# Patient Record
Sex: Female | Born: 1975 | Race: White | Hispanic: No | Marital: Single | Smoking: Former smoker
Health system: Southern US, Community
[De-identification: ages and names within clinical notes are randomized; demographics above are authoritative.]

## PROBLEM LIST (undated history)

## (undated) DIAGNOSIS — E079 Disorder of thyroid, unspecified: Secondary | ICD-10-CM

## (undated) DIAGNOSIS — G932 Benign intracranial hypertension: Secondary | ICD-10-CM

## (undated) HISTORY — PX: OTHER SURGICAL HISTORY: SHX169

## (undated) HISTORY — DX: Disorder of thyroid, unspecified: E07.9

## (undated) HISTORY — DX: Benign intracranial hypertension: G93.2

---

## 2015-06-16 ENCOUNTER — Ambulatory Visit (INDEPENDENT_AMBULATORY_CARE_PROVIDER_SITE_OTHER): Payer: Medicaid Other | Admitting: Family Medicine

## 2015-06-16 ENCOUNTER — Encounter: Payer: Self-pay | Admitting: Family Medicine

## 2015-06-16 VITALS — BP 130/78 | HR 100 | Temp 98.8°F | Resp 18 | Ht 67.0 in | Wt 383.3 lb

## 2015-06-16 DIAGNOSIS — R0609 Other forms of dyspnea: Secondary | ICD-10-CM | POA: Insufficient documentation

## 2015-06-16 DIAGNOSIS — R600 Localized edema: Secondary | ICD-10-CM

## 2015-06-16 DIAGNOSIS — G932 Benign intracranial hypertension: Secondary | ICD-10-CM | POA: Diagnosis not present

## 2015-06-16 MED ORDER — FUROSEMIDE 40 MG PO TABS: 40.0000 mg | ORAL_TABLET | Freq: Two times a day (BID) | ORAL | Status: DC

## 2015-06-16 NOTE — Progress Notes (Signed)
Name: Anna Fitzgerald   MRN: [id number]    DOB: 07/15/1976   Date:06/16/2015       Progress Note  Subjective  Chief Complaint  Chief Complaint  Patient presents with  . Leg Swelling    New patient to practice  . Foot Swelling    HPI   Leg Swelling Pt. Presents with slowly worsening swelling of both legs over the last few months. She has some shortness of breath with usual activities, profuse sweating, no chest pain or tightness. Pt. Has history of pseudotumor cerebri in the past and has been on Acetazolamide. This was stopped due to uncomfortable numbness and tingling in both hands.  Past Medical History  Diagnosis Date  . Thyroid disease   . Pseudotumor cerebri     Was on Acetazolamide.  . Thyroid disease     Discovered during pregnancy, was on Levothyroxine.    History reviewed. No pertinent past surgical history.  Family History  Problem Relation Age of Onset  . Hypertension Father     Social History   Social History  . Marital Status: Single    Spouse Name: N/A  . Number of Children: N/A  . Years of Education: N/A   Occupational History  . Not on file.   Social History Main Topics  . Smoking status: Never Smoker   . Smokeless tobacco: Not on file  . Alcohol Use: No  . Drug Use: No  . Sexual Activity: Not Currently   Other Topics Concern  . Not on file   Social History Narrative  . No narrative on file    No current outpatient prescriptions on file.  Allergies  Allergen Reactions  . Sulfa Antibiotics     Other reaction(s): OTHER   Review of Systems  Constitutional: Negative for fever and chills.  Eyes: Positive for blurred vision (intermittent blurred vision).  Respiratory: Positive for shortness of breath.   Cardiovascular: Positive for leg swelling. Negative for chest pain.  Neurological: Positive for dizziness and headaches.    Objective  Filed Vitals:   06/16/15 1336  BP: 130/78  Pulse: 100  Temp: 98.8 F (37.1 C)  TempSrc: Oral   Resp: 18  Height:  (1.702 m)  Weight: 383 lb 4.8 oz (173.864 kg)  SpO2: 97%    Physical Exam  Constitutional: She is oriented to person, place, and time and well-developed, well-nourished, and in no distress.  HENT:  Head: Normocephalic and atraumatic.  Cardiovascular: Normal rate, regular rhythm and normal heart sounds.   Pulmonary/Chest: Effort normal and breath sounds normal. She has no wheezes. She has no rales.  Abdominal: Soft. Bowel sounds are normal. There is no tenderness.  Musculoskeletal:  Generalized bilateral lower extremity swelling with tenderness to deep palpation.  Neurological: She is alert and oriented to person, place, and time.  Skin: Skin is warm and dry.  Nursing note and vitals reviewed.    Assessment & Plan  1. Pseudotumor cerebri Patient will be referred to neurology for further management of pseudotumor cerebri. She has been on acetazolamide in the past and does not wish to take it again because of paresthesias and numbness in both hands. We will start on furosemide 40 mg 2 times daily. Patient reportedly has allergies to sulfonamides but does not remember the type of reaction. - Ambulatory referral to Neurology - furosemide (LASIX) 40 MG tablet; Take 1 tablet (40 mg total) by mouth 2 (two) times daily.  Dispense: 60 tablet; Refill: 0  2. Bilateral leg edema  We'll obtain standard lab work for bilateral lower extremity swelling and start on furosemide 40 mg twice a day.in the past, patient reportedly had allergies to sulfa antibiotics. When asked, patient does not remember which antibiotic and  that she was a child so she does not number the exact allergic reaction. She has tolerated Acetazolamide well with only side effect of paresthesias but no allergic reaction. We have discussed with the pharmacist about the likelihood of furosemide causing an allergic reaction based on her history.we will hold off on therapy for now but will refer to neurology for  further assessment. - CBC with Differential - Comprehensive Metabolic Panel (CMET) - DG Chest 2 View; Future - TSH - T4, free - furosemide (LASIX) 40 MG tablet; Take 1 tablet (40 mg total) by mouth 2 (two) times daily.  Dispense: 60 tablet; Refill: 0  3. Dyspnea on effort  - DG Chest 2 View; Future   Anna Fitzgerald Asad A. Faylene KurtzShah [facility] 06/16/2015 1:57 PM

## 2015-06-17 ENCOUNTER — Telehealth: Payer: Self-pay | Admitting: Family Medicine

## 2015-06-17 LAB — COMPREHENSIVE METABOLIC PANEL
A/G RATIO: 2 (ref 1.1–2.5)
ALBUMIN: 4.1 g/dL (ref 3.5–5.5)
ALK PHOS: 84 IU/L (ref 39–117)
ALT: 18 IU/L (ref 0–32)
AST: 17 IU/L (ref 0–40)
BILIRUBIN TOTAL: 0.9 mg/dL (ref 0.0–1.2)
BUN / CREAT RATIO: 18 (ref 8–20)
BUN: 13 mg/dL (ref 6–20)
CHLORIDE: 102 mmol/L (ref 97–106)
CO2: 24 mmol/L (ref 18–29)
Calcium: 8.8 mg/dL (ref 8.7–10.2)
Creatinine, Ser: 0.74 mg/dL (ref 0.57–1.00)
GFR calc non Af Amer: 104 mL/min/{1.73_m2} (ref >59)
GFR, EST AFRICAN AMERICAN: 120 mL/min/{1.73_m2} (ref >59)
GLUCOSE: 78 mg/dL (ref 65–99)
Globulin, Total: 2.1 g/dL (ref 1.5–4.5)
POTASSIUM: 4.3 mmol/L (ref 3.5–5.2)
Sodium: 141 mmol/L (ref 136–144)
TOTAL PROTEIN: 6.2 g/dL (ref 6.0–8.5)

## 2015-06-17 LAB — CBC WITH DIFFERENTIAL/PLATELET
BASOS ABS: 0 10*3/uL (ref 0.0–0.2)
BASOS: 0 %
EOS (ABSOLUTE): 0.2 10*3/uL (ref 0.0–0.4)
Eos: 3 %
Hematocrit: 40 % (ref 34.0–46.6)
Hemoglobin: 13.6 g/dL (ref 11.1–15.9)
IMMATURE GRANS (ABS): 0 10*3/uL (ref 0.0–0.1)
Immature Granulocytes: 0 %
LYMPHS ABS: 1.5 10*3/uL (ref 0.7–3.1)
Lymphs: 19 %
MCH: 27.2 pg (ref 26.6–33.0)
MCHC: 34 g/dL (ref 31.5–35.7)
MCV: 80 fL (ref 79–97)
Monocytes Absolute: 0.4 10*3/uL (ref 0.1–0.9)
Monocytes: 5 %
NEUTROS ABS: 5.8 10*3/uL (ref 1.4–7.0)
Neutrophils: 73 %
PLATELETS: 223 10*3/uL (ref 150–379)
RBC: 5 x10E6/uL (ref 3.77–5.28)
RDW: 14.2 % (ref 12.3–15.4)
WBC: 8 10*3/uL (ref 3.4–10.8)

## 2015-06-17 LAB — TSH: TSH: 7.2 u[IU]/mL — AB (ref 0.450–4.500)

## 2015-06-17 LAB — T4, FREE: Free T4: 1.14 ng/dL (ref 0.82–1.77)

## 2015-06-17 MED ORDER — LEVOTHYROXINE SODIUM 25 MCG PO TABS: 25.0000 ug | ORAL_TABLET | Freq: Every day | ORAL | Status: DC

## 2015-06-17 NOTE — Telephone Encounter (Signed)
PT SAID THAT A RX WAS CALLED INTO [facility] IN [place] AND THEY CALLED BECAUSE THEY SEEN SHE HAS ALLERGIC REACTION TO SULFAR AND THE PHARM CALLED AND SPOKE WITH THE DR AND SHE CALLED TO SEE WHAT THE DR HAD DECIDED AND THE PHARM SAYS THAT THEY HAVE NO RX AT ALL FOR HER NOW. WHAT DID THE DR DECIDE FOR HER TO TAKE?

## 2015-06-17 NOTE — Telephone Encounter (Signed)
Returned call and left a voice message for patient. Spoke with pharmacist last night and discussed patient's possible allergies to sulfonamides. She has been on Acetazolamide for pseudotumor cerebri without any obvious symptoms of allergies except for paresthesias in her hands and she does not wish to take Acetazolamide again. Furosemide is a possible second line therapy for pseudotumor cerebri but in light of her possible allergies to sulfonamides, we'll recommend that patient weight until seen by neurology.

## 2015-07-18 ENCOUNTER — Ambulatory Visit (INDEPENDENT_AMBULATORY_CARE_PROVIDER_SITE_OTHER): Payer: Medicaid Other | Admitting: Family Medicine

## 2015-07-18 ENCOUNTER — Encounter: Payer: Self-pay | Admitting: Family Medicine

## 2015-07-18 VITALS — BP 132/78 | HR 102 | Temp 97.5°F | Resp 16 | Ht 67.0 in | Wt 385.9 lb

## 2015-07-18 DIAGNOSIS — E039 Hypothyroidism, unspecified: Secondary | ICD-10-CM

## 2015-07-18 DIAGNOSIS — J0100 Acute maxillary sinusitis, unspecified: Secondary | ICD-10-CM | POA: Insufficient documentation

## 2015-07-18 DIAGNOSIS — N644 Mastodynia: Secondary | ICD-10-CM

## 2015-07-18 MED ORDER — AMOXICILLIN-POT CLAVULANATE 875-125 MG PO TABS: 1.0000 | ORAL_TABLET | Freq: Two times a day (BID) | ORAL | Status: DC

## 2015-07-18 NOTE — Progress Notes (Signed)
Name: Anna Fitzgerald   MRN: [id number]    DOB: 1976-01-17   Date:07/18/2015       Progress Note  Subjective  Chief Complaint  Chief Complaint  Patient presents with  . Foot Swelling    unchanged, but a little better today.  Never started lasix due to allergie.  Has an appt next monday with neurologist    Thyroid Problem Presents for follow-up visit. Symptoms include cold intolerance, constipation, depressed mood, dry skin, fatigue (recently feeling fatigued), hair loss, heat intolerance and leg swelling. The symptoms have been stable. Past treatments include levothyroxine. The following procedures have not been performed: thyroid ultrasound and thyroidectomy. Her past medical history is significant for obesity. There is no history of diabetes.  Sinusitis This is a new problem. Episode onset: 10 days ago. The problem is unchanged. There has been no fever. Associated symptoms include congestion, coughing, headaches and sinus pressure. Pertinent negatives include no sore throat (having a 'tickle' in her throat). Past treatments include nothing.    Past Medical History  Diagnosis Date  . Thyroid disease   . Pseudotumor cerebri     Was on Acetazolamide.  . Thyroid disease     Discovered during pregnancy, was on Levothyroxine.    No past surgical history on file.  Family History  Problem Relation Age of Onset  . Hypertension Father     Social History   Social History  . Marital Status: Single    Spouse Name: N/A  . Number of Children: N/A  . Years of Education: N/A   Occupational History  . Not on file.   Social History Main Topics  . Smoking status: Never Smoker   . Smokeless tobacco: Not on file  . Alcohol Use: No  . Drug Use: No  . Sexual Activity: Not Currently   Other Topics Concern  . Not on file   Social History Narrative     Current outpatient prescriptions:  .  levothyroxine (LEVOTHROID) 25 MCG tablet, Take 1 tablet (25 mcg total) by mouth daily before  breakfast., Disp: 30 tablet, Rfl: 1  Allergies  Allergen Reactions  . Sulfa Antibiotics     Other reaction(s): OTHER   Review of Systems  Constitutional: Positive for malaise/fatigue and fatigue (recently feeling fatigued).  HENT: Positive for congestion and sinus pressure. Negative for sore throat (having a 'tickle' in her throat).   Respiratory: Positive for cough.   Gastrointestinal: Positive for constipation.  Neurological: Positive for headaches.  Endo/Heme/Allergies: Positive for cold intolerance and heat intolerance.    Objective  Filed Vitals:   07/18/15 1105  BP: 132/78  Pulse: 102  Temp: 97.5 F (36.4 C)  TempSrc: Oral  Resp: 16  Height:  (1.702 m)  Weight: 385 lb 14.4 oz (175.043 kg)  SpO2: 96%    Physical Exam  Constitutional: She is oriented to person, place, and time and well-developed, well-nourished, and in no distress.  HENT:  Right Ear: Tympanic membrane and ear canal normal.  Left Ear: Tympanic membrane and ear canal normal.  Nose: Right sinus exhibits maxillary sinus tenderness and frontal sinus tenderness. Left sinus exhibits maxillary sinus tenderness and frontal sinus tenderness.  Mouth/Throat: Mucous membranes are normal. Posterior oropharyngeal erythema present.  Cardiovascular: Normal rate, regular rhythm and normal heart sounds.   Neurological: She is alert and oriented to person, place, and time.  Nursing note and vitals reviewed.  Breast Tenderness Pt. Complains of bilateral breast tenderness, first noticed over 2 months ago. No breast mass,  tenderness is constant, no relation to time of month or cycle. No N/V but does have extreme fatigue.  Recent Results (from the past 2160 hour(s))  CBC with Differential     Status: None   Collection Time: 06/16/15  2:23 PM  Result Value Ref Range   WBC 8.0 3.4 - 10.8 x10E3/uL   RBC 5.00 3.77 - 5.28 x10E6/uL   Hemoglobin 13.6 11.1 - 15.9 g/dL   Hematocrit 16.140.0 09.634.0 - 46.6 %   MCV 80 79 - 97 fL    MCH 27.2 26.6 - 33.0 pg   MCHC 34.0 31.5 - 35.7 g/dL   RDW 04.514.2 40.912.3 - 81.115.4 %   Platelets 223 150 - 379 x10E3/uL   Neutrophils 73 %   Lymphs 19 %   Monocytes 5 %   Eos 3 %   Basos 0 %   Neutrophils Absolute 5.8 1.4 - 7.0 x10E3/uL   Lymphocytes Absolute 1.5 0.7 - 3.1 x10E3/uL   Monocytes Absolute 0.4 0.1 - 0.9 x10E3/uL   EOS (ABSOLUTE) 0.2 0.0 - 0.4 x10E3/uL   Basophils Absolute 0.0 0.0 - 0.2 x10E3/uL   Immature Granulocytes 0 %   Immature Grans (Abs) 0.0 0.0 - 0.1 x10E3/uL  Comprehensive Metabolic Panel (CMET)     Status: None   Collection Time: 06/16/15  2:23 PM  Result Value Ref Range   Glucose 78 65 - 99 mg/dL   BUN 13 6 - 20 mg/dL   Creatinine, Ser 9.140.74 0.57 - 1.00 mg/dL   GFR calc non Af Amer 104 >59 mL/min/1.73   GFR calc Af Amer 120 >59 mL/min/1.73   BUN/Creatinine Ratio 18 8 - 20   Sodium 141 136 - 144 mmol/L   Potassium 4.3 3.5 - 5.2 mmol/L   Chloride 102 97 - 106 mmol/L   CO2 24 18 - 29 mmol/L   Calcium 8.8 8.7 - 10.2 mg/dL   Total Protein 6.2 6.0 - 8.5 g/dL   Albumin 4.1 3.5 - 5.5 g/dL   Globulin, Total 2.1 1.5 - 4.5 g/dL   Albumin/Globulin Ratio 2.0 1.1 - 2.5   Bilirubin Total 0.9 0.0 - 1.2 mg/dL   Alkaline Phosphatase 84 39 - 117 IU/L   AST 17 0 - 40 IU/L   ALT 18 0 - 32 IU/L  TSH     Status: Abnormal   Collection Time: 06/16/15  2:23 PM  Result Value Ref Range   TSH 7.200 (H) 0.450 - 4.500 uIU/mL  T4, free     Status: None   Collection Time: 06/16/15  2:23 PM  Result Value Ref Range   Free T4 1.14 0.82 - 1.77 ng/dL     Assessment & Plan  1. Hypothyroidism, unspecified hypothyroidism type Recheck TSH and Free T4 and follow up. - TSH - T4, free  2. Acute maxillary sinusitis, recurrence not specified  - amoxicillin-clavulanate (AUGMENTIN) 875-125 MG tablet; Take 1 tablet by mouth 2 (two) times daily.  Dispense: 20 tablet; Refill: 0  3. Breast tenderness  - Ambulatory referral to Gynecology   [facility] Asad A. Faylene KurtzShah [facility] 07/18/2015 11:10 AM

## 2015-07-19 LAB — T4, FREE: Free T4: 1.16 ng/dL (ref 0.82–1.77)

## 2015-07-19 LAB — TSH: TSH: 6.96 u[IU]/mL — AB (ref 0.450–4.500)

## 2015-07-27 ENCOUNTER — Other Ambulatory Visit: Payer: Self-pay | Admitting: Neurology

## 2015-07-27 DIAGNOSIS — G932 Benign intracranial hypertension: Secondary | ICD-10-CM

## 2015-07-29 ENCOUNTER — Telehealth: Payer: Self-pay

## 2015-07-29 MED ORDER — LEVOTHYROXINE SODIUM 25 MCG PO TABS: 50.0000 ug | ORAL_TABLET | Freq: Every day | ORAL | Status: DC

## 2015-07-29 NOTE — Telephone Encounter (Signed)
Medication has been filled and [facility] to [facility] [place]

## 2015-08-03 ENCOUNTER — Encounter: Payer: Self-pay | Admitting: Obstetrics and Gynecology

## 2015-08-03 ENCOUNTER — Ambulatory Visit (INDEPENDENT_AMBULATORY_CARE_PROVIDER_SITE_OTHER): Payer: Medicaid Other | Admitting: Obstetrics and Gynecology

## 2015-08-03 VITALS — BP 118/73 | HR 80 | Ht 66.0 in | Wt 381.1 lb

## 2015-08-03 DIAGNOSIS — N644 Mastodynia: Secondary | ICD-10-CM

## 2015-08-03 DIAGNOSIS — B359 Dermatophytosis, unspecified: Secondary | ICD-10-CM

## 2015-08-03 MED ORDER — VITAMIN E 180 MG (400 UNIT) PO CAPS: 800.0000 [IU] | ORAL_CAPSULE | Freq: Every day | ORAL | Status: AC

## 2015-08-03 MED ORDER — TOLNAFTATE 1 % EX AERP: INHALATION_SPRAY | Freq: Two times a day (BID) | CUTANEOUS | Status: AC

## 2015-08-03 NOTE — Patient Instructions (Signed)

## 2015-08-03 NOTE — Progress Notes (Signed)
Subjective:     Patient ID: Duffy BruceDanielle Fitzgerald, female   DOB: [birth year], 39 y.o.   MRN: [id number]  HPI Reports sudden onset breast tenderness bilaterally a few months ago, denies any recent trauma to breast, just felt sore when 38 yo son layed his head on her chest. Does report 1-2 pots of coffee daily but no tea or sodas.  Review of Systems Bilateral breast soreness- x2 months  rash under left arm- itchy and red x 1 week    Objective:   Physical Exam A&O x4  well groomed morbidly obese female in no current distress Breasts: breasts appear normal, no suspicious masses, no skin or nipple changes or axillary nodes, risk and benefit of breast self-exam was discussed, generalized tenderness in upper inner quadrants bilaterally.tinea rash noted superficial under left axilla    Assessment:     caffeine induced breast tenderness  tinea of left axilla     Plan:     Vit E 800 IU daily as needed. To decrease caffeine intake daily Tinactin spray to affected area x 1 week and prn  [name], CNM

## 2015-08-11 ENCOUNTER — Ambulatory Visit
Admission: RE | Admit: 2015-08-11 | Discharge: 2015-08-11 | Disposition: A | Discharge status: Home / Self Care | Payer: Medicaid Other | Source: Ambulatory Visit | Attending: Neurology | Admitting: Neurology

## 2015-08-11 DIAGNOSIS — G932 Benign intracranial hypertension: Secondary | ICD-10-CM | POA: Diagnosis not present

## 2015-08-11 DIAGNOSIS — R51 Headache: Secondary | ICD-10-CM | POA: Diagnosis present

## 2015-08-11 LAB — PROTIME-INR
INR: 0.98
Prothrombin Time: 13.2 seconds (ref 11.4–15.0)

## 2015-08-11 LAB — CBC
HEMATOCRIT: 38.6 % (ref 35.0–47.0)
Hemoglobin: 12.6 g/dL (ref 12.0–16.0)
MCH: 26.5 pg (ref 26.0–34.0)
MCHC: 32.7 g/dL (ref 32.0–36.0)
MCV: 81.1 fL (ref 80.0–100.0)
PLATELETS: 155 10*3/uL (ref 150–440)
RBC: 4.76 MIL/uL (ref 3.80–5.20)
RDW: 15 % — AB (ref 11.5–14.5)
WBC: 7 10*3/uL (ref 3.6–11.0)

## 2015-08-11 LAB — APTT: aPTT: 35 seconds (ref 24–36)

## 2015-08-11 LAB — HCG, QUANTITATIVE, PREGNANCY: hCG, Beta Chain, Quant, S: <1 m[IU]/mL (ref <5)

## 2015-11-07 ENCOUNTER — Ambulatory Visit (INDEPENDENT_AMBULATORY_CARE_PROVIDER_SITE_OTHER): Payer: Medicaid Other | Admitting: Family Medicine

## 2015-11-07 ENCOUNTER — Encounter: Payer: Self-pay | Admitting: Family Medicine

## 2015-11-07 VITALS — BP 128/72 | HR 95 | Temp 98.5°F | Resp 18 | Ht 67.0 in | Wt 387.7 lb

## 2015-11-07 DIAGNOSIS — J0100 Acute maxillary sinusitis, unspecified: Secondary | ICD-10-CM

## 2015-11-07 DIAGNOSIS — E039 Hypothyroidism, unspecified: Secondary | ICD-10-CM | POA: Diagnosis not present

## 2015-11-07 MED ORDER — AZITHROMYCIN 250 MG PO TABS: ORAL_TABLET | ORAL | Status: DC

## 2015-11-07 NOTE — Progress Notes (Signed)
Name: Anna Fitzgerald   MRN: [id number]    DOB: [birth year]   Date:11/07/2015       Progress Note  Subjective  Chief Complaint  Chief Complaint  Patient presents with  . Follow-up    Medicaiton not working (levothyroxine)     HPI  Hypothyroidism: Long history of Hypothyroidism, last TSH was elevated at 6.960, current dose of Levothyroxine is 50 mcg daily. She feels no better clinically, (fatigue, hot and cold chills, leg swelling, dry skin) are no better. She is requesting a referral to Endocrinolgoy.  Sinus Symptoms: Symptoms include headache, sinus pressure, sore throat, mild fever. Symptoms present for 2 weeks.  Past Medical History  Diagnosis Date  . Thyroid disease   . Pseudotumor cerebri     Was on Acetazolamide.  . Thyroid disease     Discovered during pregnancy, was on Levothyroxine.    Past Surgical History  Procedure Laterality Date  . None      Family History  Problem Relation Age of Onset  . Hypertension Father   . Diabetes Maternal Grandmother   . Cancer Neg Hx     Social History   Social History  . Marital Status: Single    Spouse Name: N/A  . Number of Children: N/A  . Years of Education: N/A   Occupational History  . Not on file.   Social History Main Topics  . Smoking status: Never Smoker   . Smokeless tobacco: Not on file  . Alcohol Use: 0.0 oz/week    0 Standard drinks or equivalent per week     Comment: wine twice a month  . Drug Use: No  . Sexual Activity: Not Currently    Birth Control/ Protection: IUD   Other Topics Concern  . Not on file   Social History Narrative     Current outpatient prescriptions:  .  Ascorbic Acid (VITAMIN C) 100 MG tablet, Take 100 mg by mouth daily., Disp: , Rfl:  .  levonorgestrel (MIRENA) 20 MCG/24HR IUD, 1 each by Intrauterine route once., Disp: , Rfl:  .  levothyroxine (LEVOTHROID) 25 MCG tablet, Take 2 tablets (50 mcg total) by mouth daily before breakfast., Disp: 60 tablet, Rfl: 0 .  topiramate  (TOPAMAX) 50 MG tablet, Week 1: Take 1 tab and bedtime. Week 2: Take 2 tabs at bedtime. Week 3: Take 3 tabs at bedtime. Week 4: Take 4 tabs at bedtime and continue, Disp: , Rfl:  .  vitamin E (VITAMIN E) 400 UNIT capsule, Take 2 capsules (800 Units total) by mouth daily., Disp: 100 capsule, Rfl: 1 .  tolnaftate (TINACTIN) 1 % spray, Apply topically 2 (two) times daily. (Patient not taking: Reported on 11/07/2015), Disp: 130 g, Rfl: 0  Allergies  Allergen Reactions  . Sulfa Antibiotics     Other reaction(s): OTHER     ROS    Objective  Filed Vitals:   11/07/15 1100  BP: 128/72  Pulse: 95  Temp: 98.5 F (36.9 C)  TempSrc: Oral  Resp: 18  Height:  (1.702 m)  Weight: 387 lb 11.2 oz (175.86 kg)  SpO2: 98%    Physical Exam  Constitutional: She is oriented to person, place, and time and well-developed, well-nourished, and in no distress.  HENT:  Nose: Right sinus exhibits maxillary sinus tenderness. Left sinus exhibits maxillary sinus tenderness.  Mouth/Throat: No posterior oropharyngeal erythema.  Cardiovascular: Normal rate and regular rhythm.   Pulmonary/Chest: Effort normal and breath sounds normal.  Musculoskeletal:  Right ankle: She exhibits swelling.       Left ankle: She exhibits swelling.  Neurological: She is alert and oriented to person, place, and time.  Nursing note and vitals reviewed.      Assessment & Plan  1. Hypothyroidism, unspecified hypothyroidism type Referral to endocrinology. Repeat TFTs - T4, free - T3 Uptake - Ambulatory referral to Endocrinology - TSH   2. Subacute maxillary sinusitis  - azithromycin (ZITHROMAX) 250 MG tablet; 2 tabs po x day 1, then 1 tab po q day x 4 days  Dispense: 6 tablet; Refill: 0   Abdiel Blackerby Asad A. Faylene KurtzShah [facility] 11/07/2015 11:17 AM

## 2015-11-12 LAB — T4, FREE: Free T4: 1.16 ng/dL (ref 0.82–1.77)

## 2015-11-12 LAB — TSH: TSH: 7.13 u[IU]/mL — AB (ref 0.450–4.500)

## 2015-11-12 LAB — T3 UPTAKE: T3 Uptake Ratio: 26 % (ref 24–39)

## 2015-11-16 ENCOUNTER — Telehealth: Payer: Self-pay

## 2015-11-16 MED ORDER — LEVOTHYROXINE SODIUM 75 MCG PO TABS: 75.0000 ug | ORAL_TABLET | ORAL | Status: AC

## 2015-11-16 NOTE — Telephone Encounter (Signed)
Lab results have been reported to patient and a prescription for Levothyroxine 75 mcg every morning has been sent to [facility] per Dr. Sherryll BurgerShah, patient has been notified

## 2016-03-07 ENCOUNTER — Encounter: Payer: Self-pay | Admitting: Family Medicine

## 2016-03-07 ENCOUNTER — Ambulatory Visit
Admission: RE | Admit: 2016-03-07 | Discharge: 2016-03-07 | Disposition: A | Discharge status: Home / Self Care | Payer: Medicaid Other | Source: Ambulatory Visit | Attending: Family Medicine | Admitting: Family Medicine

## 2016-03-07 ENCOUNTER — Ambulatory Visit (INDEPENDENT_AMBULATORY_CARE_PROVIDER_SITE_OTHER): Payer: Medicaid Other | Admitting: Family Medicine

## 2016-03-07 DIAGNOSIS — R0683 Snoring: Secondary | ICD-10-CM | POA: Insufficient documentation

## 2016-03-07 DIAGNOSIS — W260XXA Contact with knife, initial encounter: Secondary | ICD-10-CM | POA: Insufficient documentation

## 2016-03-07 DIAGNOSIS — S6991XA Unspecified injury of right wrist, hand and finger(s), initial encounter: Secondary | ICD-10-CM | POA: Diagnosis present

## 2016-03-07 NOTE — Progress Notes (Signed)
Name: Anna Fitzgerald   MRN: [id number]    DOB: 09/01/1975   Date:03/07/2016       Progress Note  Subjective  Chief Complaint  Chief Complaint  Patient presents with  . Hand Pain    right thumb injury  . Referral    for sleep study    HPI  R. Thumb Pain:  Pt. Presents for slowly worsening pain in the right thumb, started after she 'slashed my thumb open' 2 months ago. She was cutting apples and the knife blade dislodged and she cut her right thumb. Her dad bandaged the area immediately, then applied super-glue to the thumb 4 days later. She reports the thumb has healed relatively well but she has pain in the base and the second joint of the thumb. She feels a 'click' in her thumb with flexion, pain is slowly worsening and she is concerned may have 'nicked a tendon'.    Referral for Sleep Study: Pt. Requesting a referral for sleep study, she has never had a sleep study before but reports snoring 'like a logger'. She is morbidly obese, always had problems while breathing as a child. She could not tolerate a sleep study many years ago which involved putting a mask on her face.She is certain that she has sleep apnea.    Past Medical History:  Diagnosis Date  . Pseudotumor cerebri    Was on Acetazolamide.  . Thyroid disease   . Thyroid disease    Discovered during pregnancy, was on Levothyroxine.    Past Surgical History:  Procedure Laterality Date  . none      Family History  Problem Relation Age of Onset  . Hypertension Father   . Diabetes Maternal Grandmother   . Cancer Neg Hx     Social History   Social History  . Marital status: Single    Spouse name: N/A  . Number of children: N/A  . Years of education: N/A   Occupational History  . Not on file.   Social History Main Topics  . Smoking status: Never Smoker  . Smokeless tobacco: Not on file  . Alcohol use 0.0 oz/week     Comment: wine twice a month  . Drug use: No  . Sexual activity: Not Currently   Birth control/ protection: IUD   Other Topics Concern  . Not on file   Social History Narrative  . No narrative on file     Current Outpatient Prescriptions:  .  Ascorbic Acid (VITAMIN C) 100 MG tablet, Take 100 mg by mouth daily., Disp: , Rfl:  .  azithromycin (ZITHROMAX) 250 MG tablet, 2 tabs po x day 1, then 1 tab po q day x 4 days, Disp: 6 tablet, Rfl: 0 .  levonorgestrel (MIRENA) 20 MCG/24HR IUD, 1 each by Intrauterine route once., Disp: , Rfl:  .  levothyroxine (SYNTHROID, LEVOTHROID) 75 MCG tablet, Take 1 tablet (75 mcg total) by mouth every morning., Disp: 90 tablet, Rfl: 0 .  tolnaftate (TINACTIN) 1 % spray, Apply topically 2 (two) times daily., Disp: 130 g, Rfl: 0 .  topiramate (TOPAMAX) 50 MG tablet, Week 1: Take 1 tab and bedtime. Week 2: Take 2 tabs at bedtime. Week 3: Take 3 tabs at bedtime. Week 4: Take 4 tabs at bedtime and continue, Disp: , Rfl:  .  vitamin E (VITAMIN E) 400 UNIT capsule, Take 2 capsules (800 Units total) by mouth daily., Disp: 100 capsule, Rfl: 1  Allergies  Allergen Reactions  . Sulfa Antibiotics  Other reaction(s): OTHER     Review of Systems  Constitutional: Negative for chills and fever.  Respiratory: Negative for cough and shortness of breath.   Cardiovascular: Negative for chest pain and palpitations.  Musculoskeletal: Positive for joint pain.    Objective  Vitals:   03/07/16 1019  BP: 132/84  Pulse: (!) 112  Resp: 18  Temp: 98.6 F (37 C)  TempSrc: Oral  SpO2: 97%  Weight: (!) 372 lb 8 oz (169 kg)  Height:  (1.702 m)    Physical Exam  Constitutional: She is well-developed, well-nourished, and in no distress.  HENT:  Head: Normocephalic and atraumatic.  Mouth/Throat: No posterior oropharyngeal erythema.  Neck: Neck supple.  Cardiovascular: Normal rate, regular rhythm, S1 normal and S2 normal.   Pulmonary/Chest: Effort normal. No respiratory distress. She has no decreased breath sounds. She has no rhonchi.    Musculoskeletal:       Hands: Tenderness to palpation over the base of right thumb extending proximally towards the wrist joint, no erythema or swelling. Normal ROM right thumb with some pain at maximal hyperflexion.   Psychiatric: Mood, memory, affect and judgment normal.  Nursing note and vitals reviewed.       Assessment & Plan  1. Injury of right thumb, initial encounter Obtain x-ray of right hand, may need referral to hand specialist - DG Hand Complete Right; Future  2. Snoring  - Ambulatory referral to Sleep Studies   Anna Fitzgerald Asad A. Faylene Kurtz [facility] 03/07/2016 10:25 AM

## 2016-03-09 ENCOUNTER — Telehealth: Payer: Self-pay | Admitting: Emergency Medicine

## 2016-03-09 DIAGNOSIS — S6991XA Unspecified injury of right wrist, hand and finger(s), initial encounter: Secondary | ICD-10-CM

## 2016-03-09 NOTE — Telephone Encounter (Signed)
Patient notified of results and appointment was made

## 2016-04-09 ENCOUNTER — Encounter: Payer: Medicaid Other | Admitting: Family Medicine

## 2016-04-23 ENCOUNTER — Ambulatory Visit
Admission: RE | Admit: 2016-04-23 | Discharge: 2016-04-23 | Disposition: A | Discharge status: Home / Self Care | Payer: Medicaid Other | Source: Ambulatory Visit | Attending: Family Medicine | Admitting: Family Medicine

## 2016-04-23 ENCOUNTER — Ambulatory Visit (INDEPENDENT_AMBULATORY_CARE_PROVIDER_SITE_OTHER): Payer: Medicaid Other | Admitting: Family Medicine

## 2016-04-23 ENCOUNTER — Encounter: Payer: Self-pay | Admitting: Family Medicine

## 2016-04-23 VITALS — BP 138/76 | HR 96 | Temp 97.9°F | Resp 18 | Ht 67.0 in | Wt 363.3 lb

## 2016-04-23 DIAGNOSIS — M25512 Pain in left shoulder: Secondary | ICD-10-CM | POA: Diagnosis not present

## 2016-04-23 MED ORDER — NAPROXEN 500 MG PO TABS: 500.0000 mg | ORAL_TABLET | Freq: Two times a day (BID) | ORAL | 0 refills | Status: AC

## 2016-04-23 NOTE — Progress Notes (Signed)
Name: Anna Fitzgerald   MRN: [id number]    DOB: 03-17-1976   Date:04/23/2016       Progress Note  Subjective  Chief Complaint  Chief Complaint  Patient presents with  . Annual Exam    Shoulder Pain   The pain is present in the left shoulder and left arm (feels pain and a feeling as if shoulder is 'out of joint; or 'pulling away'). This is a new problem. The current episode started more than 1 month ago (4 months ago). The pain is at a severity of 6/10. Associated symptoms include numbness (sometimes when she is sleeping with left arm above her head, she feels as if her entire arm is gone to sleep). Pertinent negatives include no fever. The symptoms are aggravated by activity (raising the arm above midline). Treatments tried: Has tried 'trigger point therapy' at home. The treatment provided no relief.    Past Medical History:  Diagnosis Date  . Pseudotumor cerebri    Was on Acetazolamide.  . Thyroid disease   . Thyroid disease    Discovered during pregnancy, was on Levothyroxine.    Past Surgical History:  Procedure Laterality Date  . none      Family History  Problem Relation Age of Onset  . Hypertension Father   . Diabetes Maternal Grandmother   . Cancer Neg Hx     Social History   Social History  . Marital status: Single    Spouse name: N/A  . Number of children: N/A  . Years of education: N/A   Occupational History  . Not on file.   Social History Main Topics  . Smoking status: Never Smoker  . Smokeless tobacco: Not on file  . Alcohol use 0.0 oz/week     Comment: wine twice a month  . Drug use: No  . Sexual activity: Not Currently    Birth control/ protection: IUD   Other Topics Concern  . Not on file   Social History Narrative  . No narrative on file     Current Outpatient Prescriptions:  .  Ascorbic Acid (VITAMIN C) 100 MG tablet, Take 100 mg by mouth daily., Disp: , Rfl:  .  docusate sodium (COLACE) 250 MG capsule, Take by mouth., Disp: , Rfl:   .  levonorgestrel (MIRENA) 20 MCG/24HR IUD, 1 each by Intrauterine route once., Disp: , Rfl:  .  levothyroxine (SYNTHROID, LEVOTHROID) 75 MCG tablet, Take 1 tablet (75 mcg total) by mouth every morning., Disp: 90 tablet, Rfl: 0 .  phentermine 15 MG capsule, Take by mouth., Disp: , Rfl:  .  tolnaftate (TINACTIN) 1 % spray, Apply topically 2 (two) times daily., Disp: 130 g, Rfl: 0 .  topiramate (TOPAMAX) 50 MG tablet, Week 1: Take 1 tab and bedtime. Week 2: Take 2 tabs at bedtime. Week 3: Take 3 tabs at bedtime. Week 4: Take 4 tabs at bedtime and continue, Disp: , Rfl:  .  vitamin E (VITAMIN E) 400 UNIT capsule, Take 2 capsules (800 Units total) by mouth daily., Disp: 100 capsule, Rfl: 1  Allergies  Allergen Reactions  . Sulfa Antibiotics     Other reaction(s): OTHER    Review of Systems  Constitutional: Negative for fever.  Musculoskeletal: Positive for joint pain.  Neurological: Positive for numbness (sometimes when she is sleeping with left arm above her head, she feels as if her entire arm is gone to sleep).    Objective  Vitals:   04/23/16 1121  BP: 138/76  Pulse:  96  Resp: 18  Temp: 97.9 F (36.6 C)  TempSrc: Oral  SpO2: 96%  Weight: (!) 363 lb 4.8 oz (164.8 kg)  Height: 5\' 7"  (1.702 m)    Physical Exam  Constitutional: She is oriented to person, place, and time and well-developed, well-nourished, and in no distress.  Cardiovascular: Normal rate, regular rhythm and normal heart sounds.   No murmur heard. Pulmonary/Chest: Effort normal and breath sounds normal.  Musculoskeletal:       Left shoulder: She exhibits tenderness, swelling and pain.  Pain worse with raising the left arm past midline, internal rotation of the left shoulder joint is painful,   Neurological: She is alert and oriented to person, place, and time.  Psychiatric: Mood, memory, affect and judgment normal.  Nursing note and vitals reviewed.   Assessment & Plan  1. Left shoulder pain Based on  symptoms and exam, suspect rotator cuff pathology start on NSAID therapy and obtain x-ray. We will refer to orthopedics after review of x-ray - DG Shoulder Left; Future - naproxen (NAPROSYN) 500 MG tablet; Take 1 tablet (500 mg total) by mouth 2 (two) times daily with a meal.  Dispense: 30 tablet; Refill: 0   Ronaldo Crilly Asad A. Faylene KurtzShah [facility] 04/23/2016 11:32 AM

## 2016-04-26 ENCOUNTER — Other Ambulatory Visit: Payer: Self-pay | Admitting: Family Medicine

## 2016-04-26 DIAGNOSIS — M25512 Pain in left shoulder: Secondary | ICD-10-CM | POA: Insufficient documentation

## 2017-02-05 ENCOUNTER — Telehealth: Payer: Self-pay | Admitting: Family Medicine

## 2017-02-14 NOTE — Telephone Encounter (Signed)
errenous °

## 2017-04-05 ENCOUNTER — Encounter: Payer: Self-pay | Admitting: Family Medicine

## 2017-04-05 ENCOUNTER — Ambulatory Visit (INDEPENDENT_AMBULATORY_CARE_PROVIDER_SITE_OTHER): Payer: Medicaid Other | Admitting: Family Medicine

## 2017-04-05 VITALS — BP 136/72 | HR 98 | Temp 97.7°F | Resp 16 | Ht 67.0 in | Wt >= 6400 oz

## 2017-04-05 DIAGNOSIS — Z6841 Body Mass Index (BMI) 40.0 and over, adult: Secondary | ICD-10-CM

## 2017-04-05 DIAGNOSIS — F331 Major depressive disorder, recurrent, moderate: Secondary | ICD-10-CM

## 2017-04-05 DIAGNOSIS — R0683 Snoring: Secondary | ICD-10-CM | POA: Diagnosis not present

## 2017-04-05 MED ORDER — BUPROPION HCL ER (XL) 150 MG PO TB24: 150.0000 mg | ORAL_TABLET | Freq: Every day | ORAL | 0 refills | Status: DC

## 2017-04-05 MED ORDER — LIRAGLUTIDE -WEIGHT MANAGEMENT 18 MG/3ML ~~LOC~~ SOPN: 3.0000 mg | PEN_INJECTOR | Freq: Every day | SUBCUTANEOUS | 2 refills | Status: DC

## 2017-04-05 NOTE — Progress Notes (Signed)
Name: Anna Fitzgerald   MRN: [id number]    DOB: 02-08-1976   Date:04/05/2017       Progress Note  Subjective  Chief Complaint  Chief Complaint  Patient presents with  . Follow-up    Abdonrmal labs from Endo Doctor    HPI  Pt. Presents for follow up of abnormal weight gain and generalized swelling, she has history of hypothyroidism and was being followed by Endocrinologist, on Levothyroxine 112 mcg qAM based on last office note. Patient reports having gained at least 60 lbs in last 4 months. Prior to that, she had lost 50 lbs using Phentermine which was prescribed by Endocrinologist.  She reports feeling frustrated at weight gain and feels depressed. She has been swelling a lot in legs and ankles, hands and face.  She had her thyroid medication adjusted based on lab work but other than that, she is on no prescription medication.  She reports having been told that she snores at night, has not been able to get a sleep study completed.  Because of her weight gain, she feels depressed, cries frequently, avoids activities that she once enjoyed and feels sad most of the time. She has history of major depression as a child and was briefly on psychotropic medication.   Past Medical History:  Diagnosis Date  . Pseudotumor cerebri    Was on Acetazolamide.  . Thyroid disease   . Thyroid disease    Discovered during pregnancy, was on Levothyroxine.    Past Surgical History:  Procedure Laterality Date  . none      Family History  Problem Relation Age of Onset  . Hypertension Father   . Diabetes Maternal Grandmother   . Cancer Neg Hx     Social History   Social History  . Marital status: Single    Spouse name: N/A  . Number of children: N/A  . Years of education: N/A   Occupational History  . Not on file.   Social History Main Topics  . Smoking status: Former Smoker    Start date: 08/13/2010  . Smokeless tobacco: Never Used  . Alcohol use 0.0 oz/week     Comment: wine twice a  month  . Drug use: No  . Sexual activity: Not Currently    Birth control/ protection: IUD   Other Topics Concern  . Not on file   Social History Narrative  . No narrative on file     Current Outpatient Prescriptions:  .  Ascorbic Acid (VITAMIN C) 100 MG tablet, Take 100 mg by mouth daily., Disp: , Rfl:  .  B Complex Vitamins (VITAMIN B COMPLEX) TABS, Take 1 tablet by mouth daily., Disp: , Rfl:  .  Cholecalciferol (VITAMIN D) 2000 units tablet, Take 1 tablet by mouth daily., Disp: , Rfl:  .  Digestive Enzymes (PAPAYA ENZYME) TABS, Take 1 tablet by mouth daily., Disp: , Rfl:  .  Garlic 2000 MG CAPS, Take 1 tablet by mouth daily., Disp: , Rfl:  .  levonorgestrel (MIRENA) 20 MCG/24HR IUD, 1 each by Intrauterine route once., Disp: , Rfl:  .  levothyroxine (SYNTHROID, LEVOTHROID) 75 MCG tablet, Take 1 tablet (75 mcg total) by mouth every morning., Disp: 90 tablet, Rfl: 0 .  vitamin B-12 (CYANOCOBALAMIN) 1000 MCG tablet, Take 1,000 mcg by mouth daily., Disp: , Rfl:  .  vitamin E (VITAMIN E) 400 UNIT capsule, Take 2 capsules (800 Units total) by mouth daily., Disp: 100 capsule, Rfl: 1 .  naproxen (NAPROSYN) 500 MG tablet,  Take 1 tablet (500 mg total) by mouth 2 (two) times daily with a meal. (Patient not taking: Reported on 04/05/2017), Disp: 30 tablet, Rfl: 0 .  phentermine 15 MG capsule, Take by mouth., Disp: , Rfl:  .  tolnaftate (TINACTIN) 1 % spray, Apply topically 2 (two) times daily. (Patient not taking: Reported on 04/05/2017), Disp: 130 g, Rfl: 0 .  topiramate (TOPAMAX) 50 MG tablet, Week 1: Take 1 tab and bedtime. Week 2: Take 2 tabs at bedtime. Week 3: Take 3 tabs at bedtime. Week 4: Take 4 tabs at bedtime and continue, Disp: , Rfl:   Allergies  Allergen Reactions  . Sulfa Antibiotics Diarrhea, Hives, Nausea Only and Nausea And Vomiting    Skin peeling, constipation,  Other reaction(s): OTHER  . Other Hives    ONLY lobster      Review of Systems  Constitutional: Positive for  chills and malaise/fatigue. Negative for fever.  HENT: Negative for congestion and sore throat.   Respiratory: Negative for cough and shortness of breath.   Cardiovascular: Negative for chest pain and palpitations (occasional recurrent palpitations.).  Gastrointestinal: Positive for constipation. Negative for abdominal pain, diarrhea, nausea and vomiting.  Psychiatric/Behavioral: Positive for depression. The patient is not nervous/anxious and does not have insomnia.     Objective  Vitals:   04/05/17 1135  BP: 136/72  Pulse: 98  Resp: 16  Temp: 97.7 F (36.5 C)  TempSrc: Oral  SpO2: 95%  Weight: (!) 400 lb 3.2 oz (181.5 kg)  Height: 5\' 7"  (1.702 m)    Physical Exam  Constitutional: She is well-developed, well-nourished, and in no distress.  HENT:  Head: Normocephalic and atraumatic.  Cardiovascular: Normal rate, regular rhythm and normal heart sounds.   No murmur heard. Pulmonary/Chest: Effort normal and breath sounds normal. She has no wheezes.  Abdominal: Soft. Bowel sounds are normal. There is no tenderness.  Musculoskeletal: She exhibits edema.  Psychiatric: Mood, memory, affect and judgment normal.  Nursing note and vitals reviewed.    Assessment & Plan  1. Morbid obesity with body mass index (BMI) of 60.0 to 69.9 in adult [facility]) Start workup for morbid obesity, obtained pertinent labs, will start on Saxenda for weight loss.  A1c 6.2%, consistent with prediabetes - CBC with Differential/Platelet - COMPLETE METABOLIC PANEL WITH GFR - Lipid panel - TSH - VITAMIN D 25 Hydroxy (Vit-D Deficiency, Fractures) - POCT glycosylated hemoglobin (Hb A1C) - Ambulatory referral to Sleep Studies - Liraglutide -Weight Management (SAXENDA) 18 MG/3ML SOPN; Inject 3 mg into the skin daily. Start with 0.6mg  [place] q day, then increase by 0.6mg  per day qwk: Max:  3 mg/day  Dispense: 5 pen; Refill: 2  2. Moderate episode of recurrent major depressive disorder (HCC) Start on Wellbutrin XL  150 mg for treatment of depression associated with abnormal weight gain, follow-up in one month - buPROPion (WELLBUTRIN XL) 150 MG 24 hr tablet; Take 1 tablet (150 mg total) by mouth daily.  Dispense: 90 tablet; Refill: 0  3. Snoring Fall to sleep medicine for sleep study as patient may have undiagnosed sleep apnea - Ambulatory referral to Sleep Studies  Izak Anding Asad A. Faylene Kurtz [facility] 04/05/2017 11:51 AM

## 2017-04-09 LAB — POCT GLYCOSYLATED HEMOGLOBIN (HGB A1C): Hemoglobin A1C: 6.2

## 2017-04-10 LAB — CBC WITH DIFFERENTIAL/PLATELET
BASOS ABS: 73 {cells}/uL (ref 0–200)
Basophils Relative: 1 %
EOS PCT: 5 %
Eosinophils Absolute: 365 cells/uL (ref 15–500)
HCT: 40.6 % (ref 35.0–45.0)
HEMOGLOBIN: 13 g/dL (ref 11.7–15.5)
Lymphocytes Relative: 24 %
Lymphs Abs: 1752 cells/uL (ref 850–3900)
MCH: 27.3 pg (ref 27.0–33.0)
MCHC: 32 g/dL (ref 32.0–36.0)
MCV: 85.1 fL (ref 80.0–100.0)
MONOS PCT: 6 %
MPV: 11.4 fL (ref 7.5–12.5)
Monocytes Absolute: 438 cells/uL (ref 200–950)
NEUTROS PCT: 64 %
Neutro Abs: 4672 cells/uL (ref 1500–7800)
Platelets: 193 10*3/uL (ref 140–400)
RBC: 4.77 MIL/uL (ref 3.80–5.10)
RDW: 15 % (ref 11.0–15.0)
WBC: 7.3 10*3/uL (ref 3.8–10.8)

## 2017-04-11 LAB — LIPID PANEL
CHOLESTEROL: 217 mg/dL — AB (ref <200)
HDL: 60 mg/dL (ref >50)
LDL CALC: 143 mg/dL — AB (ref <100)
TRIGLYCERIDES: 69 mg/dL (ref <150)
Total CHOL/HDL Ratio: 3.6 Ratio (ref <5.0)
VLDL: 14 mg/dL (ref <30)

## 2017-04-11 LAB — COMPLETE METABOLIC PANEL WITH GFR
ALT: 18 U/L (ref 6–29)
AST: 18 U/L (ref 10–30)
Albumin: 3.9 g/dL (ref 3.6–5.1)
Alkaline Phosphatase: 79 U/L (ref 33–115)
BILIRUBIN TOTAL: 0.9 mg/dL (ref 0.2–1.2)
BUN: 10 mg/dL (ref 7–25)
CALCIUM: 8.4 mg/dL — AB (ref 8.6–10.2)
CHLORIDE: 107 mmol/L (ref 98–110)
CO2: 24 mmol/L (ref 20–32)
CREATININE: 0.81 mg/dL (ref 0.50–1.10)
GFR, Est Non African American: >89 mL/min (ref >=60)
Glucose, Bld: 82 mg/dL (ref 65–99)
Potassium: 4.4 mmol/L (ref 3.5–5.3)
Sodium: 141 mmol/L (ref 135–146)
TOTAL PROTEIN: 5.9 g/dL — AB (ref 6.1–8.1)

## 2017-04-11 LAB — TSH: TSH: 14.47 mIU/L — ABNORMAL HIGH

## 2017-04-11 LAB — VITAMIN D 25 HYDROXY (VIT D DEFICIENCY, FRACTURES): VIT D 25 HYDROXY: 21 ng/mL — AB (ref 30–100)

## 2017-04-17 ENCOUNTER — Telehealth: Payer: Self-pay

## 2017-04-17 MED ORDER — VITAMIN D (ERGOCALCIFEROL) 1.25 MG (50000 UNIT) PO CAPS: 50000.0000 [IU] | ORAL_CAPSULE | ORAL | 0 refills | Status: DC

## 2017-04-17 NOTE — Telephone Encounter (Signed)
Patient has been notified of lab results and a prescription for vitamin D3 50,000 units take 1 capsule once a week x12 weeks has been [facility] per Dr. Sherryll BurgerShah, patient verbalized understanding

## 2017-05-06 ENCOUNTER — Ambulatory Visit: Payer: Medicaid Other | Admitting: Family Medicine

## 2017-05-08 ENCOUNTER — Encounter: Payer: Self-pay | Admitting: Family Medicine

## 2017-05-08 ENCOUNTER — Ambulatory Visit (INDEPENDENT_AMBULATORY_CARE_PROVIDER_SITE_OTHER): Payer: Medicaid Other | Admitting: Family Medicine

## 2017-05-08 DIAGNOSIS — F331 Major depressive disorder, recurrent, moderate: Secondary | ICD-10-CM

## 2017-05-08 DIAGNOSIS — R799 Abnormal finding of blood chemistry, unspecified: Secondary | ICD-10-CM | POA: Diagnosis not present

## 2017-05-08 LAB — TSH: TSH: 15.68 — AB (ref <=5.90)

## 2017-05-08 MED ORDER — BUPROPION HCL ER (XL) 300 MG PO TB24: 300.0000 mg | ORAL_TABLET | Freq: Every day | ORAL | 0 refills | Status: DC

## 2017-05-08 NOTE — ACP (Advance Care Planning) (Signed)
DNR

## 2017-05-08 NOTE — Progress Notes (Signed)
Name: Anna Fitzgerald   MRN: [id number]    DOB: 1975-10-03   Date:05/08/2017       Progress Note  Subjective  Chief Complaint  Chief Complaint  Patient presents with  . Follow-up    1 month discuss some of her labs     Depression         This is a chronic problem.  The onset quality is gradual.   The problem has been gradually improving since onset.  Associated symptoms include fatigue, helplessness, hopelessness, irritable, appetite change and sad.  Associated symptoms include no restlessness and no suicidal ideas.  Past treatments include SSRIs - Selective serotonin reuptake inhibitors.  Compliance with treatment is good.  Hypocalcemia: Patient has hypocalcemia evidenced by a low calcium of 8.4 mg/dL on her recent lab work, corrected calcium was 8.5 mg/dL, she has no history of hypoparathyroidism, reports no muscle cramps, weakness in arms or legs etc.  Will repeat calcium levels and ionized calcium.   Pt. Had low total protein levels on lab work obtained last month, level at 5.9 g/dL, since then, she has been taking a higher protein diet including liver, vegetables, fish etc. WiIl repeat protein levels today.    Past Medical History:  Diagnosis Date  . Pseudotumor cerebri    Was on Acetazolamide.  . Thyroid disease   . Thyroid disease    Discovered during pregnancy, was on Levothyroxine.    Past Surgical History:  Procedure Laterality Date  . none      Family History  Problem Relation Age of Onset  . Hypertension Father   . Diabetes Maternal Grandmother   . Cancer Neg Hx     Social History   Social History  . Marital status: Single    Spouse name: N/A  . Number of children: N/A  . Years of education: N/A   Occupational History  . Not on file.   Social History Main Topics  . Smoking status: Former Smoker    Start date: 08/13/2010  . Smokeless tobacco: Never Used  . Alcohol use 0.0 oz/week     Comment: wine twice a month  . Drug use: No  . Sexual activity:  Not Currently    Birth control/ protection: IUD   Other Topics Concern  . Not on file   Social History Narrative  . No narrative on file     Current Outpatient Prescriptions:  .  Ascorbic Acid (VITAMIN C) 100 MG tablet, Take 100 mg by mouth daily., Disp: , Rfl:  .  B Complex Vitamins (VITAMIN B COMPLEX) TABS, Take 1 tablet by mouth daily., Disp: , Rfl:  .  buPROPion (WELLBUTRIN XL) 150 MG 24 hr tablet, Take 1 tablet (150 mg total) by mouth daily., Disp: 90 tablet, Rfl: 0 .  Cholecalciferol (VITAMIN D) 2000 units tablet, Take 1 tablet by mouth daily., Disp: , Rfl:  .  Digestive Enzymes (PAPAYA ENZYME) TABS, Take 1 tablet by mouth daily., Disp: , Rfl:  .  Garlic 1829 MG CAPS, Take 1 tablet by mouth daily., Disp: , Rfl:  .  levonorgestrel (MIRENA) 20 MCG/24HR IUD, 1 each by Intrauterine route once., Disp: , Rfl:  .  levothyroxine (SYNTHROID, LEVOTHROID) 75 MCG tablet, Take 1 tablet (75 mcg total) by mouth every morning., Disp: 90 tablet, Rfl: 0 .  tolnaftate (TINACTIN) 1 % spray, Apply topically 2 (two) times daily., Disp: 130 g, Rfl: 0 .  vitamin B-12 (CYANOCOBALAMIN) 1000 MCG tablet, Take 1,000 mcg by mouth daily., Disp: ,  Rfl:  .  Vitamin D, Ergocalciferol, (DRISDOL) 50000 units CAPS capsule, Take 1 capsule (50,000 Units total) by mouth once a week. For 12 weeks, Disp: 12 capsule, Rfl: 0 .  vitamin E (VITAMIN E) 400 UNIT capsule, Take 2 capsules (800 Units total) by mouth daily., Disp: 100 capsule, Rfl: 1 .  Liraglutide -Weight Management (SAXENDA) 18 MG/3ML SOPN, Inject 3 mg into the skin daily. Start with 0.16m Day q day, then increase by 0.659mper day qwk: Max:  3 mg/day (Patient not taking: Reported on 05/08/2017), Disp: 5 pen, Rfl: 2 .  naproxen (NAPROSYN) 500 MG tablet, Take 1 tablet (500 mg total) by mouth 2 (two) times daily with a meal. (Patient not taking: Reported on 04/05/2017), Disp: 30 tablet, Rfl: 0 .  phentermine 15 MG capsule, Take by mouth., Disp: , Rfl:  .  topiramate  (TOPAMAX) 50 MG tablet, Week 1: Take 1 tab and bedtime. Week 2: Take 2 tabs at bedtime. Week 3: Take 3 tabs at bedtime. Week 4: Take 4 tabs at bedtime and continue, Disp: , Rfl:   Allergies  Allergen Reactions  . Sulfa Antibiotics Diarrhea, Hives, Nausea Only and Nausea And Vomiting    Skin peeling, constipation,  Other reaction(s): OTHER  . Other Hives    ONLY lobster      Review of Systems  Constitutional: Positive for appetite change and fatigue.  Psychiatric/Behavioral: Positive for depression. Negative for suicidal ideas.      Objective  Vitals:   05/08/17 1025  BP: 134/76  Pulse: (!) 106  Resp: 18  Temp: (!) 97.4 F (36.3 C)  TempSrc: Oral  SpO2: 96%  Weight: (!) 397 lb 1.6 oz (180.1 kg)  Height: '5\' 7"'  (1.702 m)    Physical Exam  Constitutional: She is oriented to person, place, and time and well-developed, well-nourished, and in no distress. She is irritable.  HENT:  Head: Normocephalic and atraumatic.  Cardiovascular: Normal rate, regular rhythm and normal heart sounds.   No murmur heard. Pulmonary/Chest: Effort normal and breath sounds normal. She has no wheezes.  Abdominal: Soft. Bowel sounds are normal. There is no tenderness.  Neurological: She is alert and oriented to person, place, and time.  Psychiatric: Mood, memory, affect and judgment normal.  Nursing note and vitals reviewed.   Recent Results (from the past 2160 hour(s))  POCT glycosylated hemoglobin (Hb A1C)     Status: Abnormal   Collection Time: 04/09/17  3:30 PM  Result Value Ref Range   Hemoglobin A1C 6.2   CBC with Differential/Platelet     Status: None   Collection Time: 04/10/17  8:53 AM  Result Value Ref Range   WBC 7.3 3.8 - 10.8 K/uL   RBC 4.77 3.80 - 5.10 MIL/uL   Hemoglobin 13.0 11.7 - 15.5 g/dL   HCT 40.6 35.0 - 45.0 %   MCV 85.1 80.0 - 100.0 fL   MCH 27.3 27.0 - 33.0 pg   MCHC 32.0 32.0 - 36.0 g/dL   RDW 15.0 11.0 - 15.0 %   Platelets 193 140 - 400 K/uL   MPV 11.4 7.5 -  12.5 fL   Neutro Abs 4,672 1,500 - 7,800 cells/uL   Lymphs Abs 1,752 850 - 3,900 cells/uL   Monocytes Absolute 438 200 - 950 cells/uL   Eosinophils Absolute 365 15 - 500 cells/uL   Basophils Absolute 73 0 - 200 cells/uL   Neutrophils Relative % 64 %   Lymphocytes Relative 24 %   Monocytes Relative 6 %  Eosinophils Relative 5 %   Basophils Relative 1 %   Smear Review Criteria for review not met   COMPLETE METABOLIC PANEL WITH GFR     Status: Abnormal   Collection Time: 04/10/17  8:53 AM  Result Value Ref Range   Sodium 141 135 - 146 mmol/L   Potassium 4.4 3.5 - 5.3 mmol/L   Chloride 107 98 - 110 mmol/L   CO2 24 20 - 32 mmol/L    Comment: ** Please note change in reference range(s). **      Glucose, Bld 82 65 - 99 mg/dL   BUN 10 7 - 25 mg/dL   Creat 0.81 0.50 - 1.10 mg/dL   Total Bilirubin 0.9 0.2 - 1.2 mg/dL   Alkaline Phosphatase 79 33 - 115 U/L   AST 18 10 - 30 U/L   ALT 18 6 - 29 U/L   Total Protein 5.9 (L) 6.1 - 8.1 g/dL   Albumin 3.9 3.6 - 5.1 g/dL   Calcium 8.4 (L) 8.6 - 10.2 mg/dL   GFR, Est African American >89 >=60 mL/min   GFR, Est Non African American >89 >=60 mL/min  Lipid panel     Status: Abnormal   Collection Time: 04/10/17  8:53 AM  Result Value Ref Range   Cholesterol 217 (H) <200 mg/dL   Triglycerides 69 <150 mg/dL   HDL 60 >50 mg/dL   Total CHOL/HDL Ratio 3.6 <5.0 Ratio   VLDL 14 <30 mg/dL   LDL Cholesterol 143 (H) <100 mg/dL  TSH     Status: Abnormal   Collection Time: 04/10/17  8:53 AM  Result Value Ref Range   TSH 14.47 (H) mIU/L    Comment:   Reference Range   > or = 20 Years  0.40-4.50   Pregnancy Range First trimester  0.26-2.66 Second trimester 0.55-2.73 Third trimester  0.43-2.91     VITAMIN D 25 Hydroxy (Vit-D Deficiency, Fractures)     Status: Abnormal   Collection Time: 04/10/17  8:53 AM  Result Value Ref Range   Vit D, 25-Hydroxy 21 (L) 30 - 100 ng/mL    Comment: Vitamin D Status           25-OH Vitamin D        Deficiency                 <20 ng/mL        Insufficiency         20 - 29 ng/mL        Optimal             > or = 30 ng/mL   For 25-OH Vitamin D testing on patients on D2-supplementation and patients for whom quantitation of D2 and D3 fractions is required, the QuestAssureD 25-OH VIT D, (D2,D3), LC/MS/MS is recommended: order code [phone] (patients > 2 yrs).      Assessment & Plan  1. Moderate episode of recurrent major depressive disorder (HCC) Steadily improving, increase Wellbutrin to 300 mg - buPROPion (WELLBUTRIN XL) 300 MG 24 hr tablet; Take 1 tablet (300 mg total) by mouth daily.  Dispense: 90 tablet; Refill: 0  2. Hypocalcemia Workup for hypocalcemia - PTH, intact and calcium - Calcium, ionized  3. Abnormal serum total protein level  - Protein, total   Kelani Robart Asad A. [facility] 05/08/2017 10:37 AM

## 2017-05-08 NOTE — ACP (Advance Care Planning) (Signed)
05/08/17 @ 1106 WAS AN ERRONEOUS ENTRY

## 2017-05-09 LAB — PTH, INTACT AND CALCIUM
CALCIUM: 9.2 mg/dL (ref 8.6–10.2)
PTH: 36 pg/mL (ref 14–64)

## 2017-05-09 LAB — CALCIUM, IONIZED: CALCIUM ION: 5.2 mg/dL (ref 4.8–5.6)

## 2017-05-09 LAB — PROTEIN, TOTAL: Total Protein: 6 g/dL — ABNORMAL LOW (ref 6.1–8.1)

## 2017-05-13 ENCOUNTER — Ambulatory Visit: Payer: Medicaid Other

## 2017-05-13 ENCOUNTER — Encounter: Payer: Self-pay | Admitting: Family Medicine

## 2017-05-13 ENCOUNTER — Telehealth: Payer: Self-pay | Admitting: Family Medicine

## 2017-05-13 DIAGNOSIS — F331 Major depressive disorder, recurrent, moderate: Secondary | ICD-10-CM

## 2017-05-13 MED ORDER — BUPROPION HCL ER (XL) 300 MG PO TB24: 300.0000 mg | ORAL_TABLET | Freq: Every day | ORAL | 0 refills | Status: DC

## 2017-05-13 NOTE — Telephone Encounter (Signed)
Patient was prescribed bupropion (Wellbutrix XL) 300 mg 24 hr tablet dispense 90.  The rx was sent to the [facility] pharmacy in [place], [place] on 05/08/17 but patient stated that the pharmacy stated they were having computer issues therefore they did not get rx.  Please resend rx.

## 2017-05-13 NOTE — Telephone Encounter (Signed)
Called pt informed her that RX was sent into pharmacy again. PT gave verbal understanding.

## 2017-05-13 NOTE — Progress Notes (Signed)
Patient came in to complete forms for sleep study. Patient completed form and I gave her a copy and faxed and scanned the original.

## 2017-05-14 ENCOUNTER — Other Ambulatory Visit: Payer: Self-pay

## 2017-05-14 DIAGNOSIS — R799 Abnormal finding of blood chemistry, unspecified: Secondary | ICD-10-CM

## 2017-05-20 ENCOUNTER — Encounter: Payer: Self-pay | Admitting: Family Medicine

## 2017-05-27 LAB — PROTEIN, TOTAL: TOTAL PROTEIN: 6.5 g/dL (ref 6.1–8.1)

## 2017-08-12 ENCOUNTER — Ambulatory Visit: Payer: Medicaid Other | Admitting: Family Medicine

## 2017-08-12 ENCOUNTER — Encounter: Payer: Self-pay | Admitting: Family Medicine

## 2017-08-12 VITALS — BP 132/78 | HR 89 | Temp 97.9°F | Resp 16 | Ht 67.0 in | Wt >= 6400 oz

## 2017-08-12 DIAGNOSIS — G8929 Other chronic pain: Secondary | ICD-10-CM | POA: Diagnosis not present

## 2017-08-12 DIAGNOSIS — F325 Major depressive disorder, single episode, in full remission: Secondary | ICD-10-CM

## 2017-08-12 DIAGNOSIS — Z23 Encounter for immunization: Secondary | ICD-10-CM | POA: Diagnosis not present

## 2017-08-12 DIAGNOSIS — E559 Vitamin D deficiency, unspecified: Secondary | ICD-10-CM

## 2017-08-12 DIAGNOSIS — M545 Low back pain, unspecified: Secondary | ICD-10-CM

## 2017-08-12 MED ORDER — TIZANIDINE HCL 4 MG PO CAPS: 4.0000 mg | ORAL_CAPSULE | Freq: Three times a day (TID) | ORAL | 0 refills | Status: DC | PRN

## 2017-08-12 NOTE — Progress Notes (Signed)
Name: Anna Fitzgerald   MRN: [id number]    DOB: [birth year]   Date:08/12/2017       Progress Note  Subjective  Chief Complaint  Chief Complaint  Patient presents with  . Medication Refill    3 month F/U  . Depression    Has been better, quit taking medication.   . Hypocalcemia    Depression         This is a recurrent problem.  The onset quality is gradual.   The problem has been gradually improving since onset.  Associated symptoms include no fatigue, no helplessness, no hopelessness, no body aches, not sad and no suicidal ideas.     The symptoms are aggravated by work stress.  Past treatments include SSRIs - Selective serotonin reuptake inhibitors (meditation.).  Compliance with treatment is good. Back Pain  This is a recurrent problem. Episode onset: 6 weeks ago. The pain is present in the lumbar spine. The quality of the pain is described as cramping (muscle spasm and stabbing pain.). The pain is at a severity of 8/10. The pain is moderate. The symptoms are aggravated by standing (prolonged standing.). Pertinent negatives include no bladder incontinence or bowel incontinence. She has tried chiropractic manipulation, home exercises, NSAIDs and ice (stretching and massaging has helped in the past.) for the symptoms.   Pt. has finished a 12-week course of Vitamin D 50,000 units weekly for Vitamin D insufficiency, last level obtained in September 2018 was 21 ng/mL, will recheck levels today.   Past Medical History:  Diagnosis Date  . Pseudotumor cerebri    Was on Acetazolamide.  . Thyroid disease   . Thyroid disease    Discovered during pregnancy, was on Levothyroxine.    Past Surgical History:  Procedure Laterality Date  . none      Family History  Problem Relation Age of Onset  . Hypertension Father   . Diabetes Maternal Grandmother   . Cancer Neg Hx     Social History   Socioeconomic History  . Marital status: Single    Spouse name: Not on file  . Number of  children: Not on file  . Years of education: Not on file  . Highest education level: Not on file  Social Needs  . Financial resource strain: Not on file  . Food insecurity - worry: Not on file  . Food insecurity - inability: Not on file  . Transportation needs - medical: Not on file  . Transportation needs - non-medical: Not on file  Occupational History  . Not on file  Tobacco Use  . Smoking status: Former Smoker    Start date: 08/13/2010  . Smokeless tobacco: Never Used  Substance and Sexual Activity  . Alcohol use: Yes    Alcohol/week: 0.0 oz    Comment: wine twice a month  . Drug use: No  . Sexual activity: Not Currently    Birth control/protection: IUD  Other Topics Concern  . Not on file  Social History Narrative  . Not on file     Current Outpatient Medications:  .  Ascorbic Acid (VITAMIN C) 100 MG tablet, Take 100 mg by mouth daily., Disp: , Rfl:  .  B Complex Vitamins (VITAMIN B COMPLEX) TABS, Take 1 tablet by mouth daily., Disp: , Rfl:  .  Cholecalciferol (VITAMIN D) 2000 units tablet, Take 1 tablet by mouth daily., Disp: , Rfl:  .  Digestive Enzymes (PAPAYA ENZYME) TABS, Take 1 tablet by mouth daily., Disp: , Rfl:  .  Garlic 2000 MG CAPS, Take 1 tablet by mouth daily., Disp: , Rfl:  .  levonorgestrel (MIRENA) 20 MCG/24HR IUD, 1 each by Intrauterine route once., Disp: , Rfl:  .  liothyronine (CYTOMEL) 5 MCG tablet, Take 1 tablet by mouth daily., Disp: , Rfl:  .  Omega-3 Fatty Acids (OMEGA-3 2100) 1050 MG CAPS, Take by mouth., Disp: , Rfl:  .  vitamin B-12 (CYANOCOBALAMIN) 1000 MCG tablet, Take 1,000 mcg by mouth daily., Disp: , Rfl:  .  vitamin E (VITAMIN E) 400 UNIT capsule, Take 2 capsules (800 Units total) by mouth daily., Disp: 100 capsule, Rfl: 1 .  buPROPion (WELLBUTRIN XL) 300 MG 24 hr tablet, Take 1 tablet (300 mg total) by mouth daily., Disp: 90 tablet, Rfl: 0 .  levothyroxine (SYNTHROID, LEVOTHROID) 75 MCG tablet, Take 1 tablet (75 mcg total) by mouth every  morning. (Patient not taking: Reported on 08/12/2017), Disp: 90 tablet, Rfl: 0 .  naproxen (NAPROSYN) 500 MG tablet, Take 1 tablet (500 mg total) by mouth 2 (two) times daily with a meal. (Patient not taking: Reported on 04/05/2017), Disp: 30 tablet, Rfl: 0 .  tolnaftate (TINACTIN) 1 % spray, Apply topically 2 (two) times daily. (Patient not taking: Reported on 08/12/2017), Disp: 130 g, Rfl: 0 .  topiramate (TOPAMAX) 50 MG tablet, Week 1: Take 1 tab and bedtime. Week 2: Take 2 tabs at bedtime. Week 3: Take 3 tabs at bedtime. Week 4: Take 4 tabs at bedtime and continue, Disp: , Rfl:  .  Vitamin D, Ergocalciferol, (DRISDOL) 50000 units CAPS capsule, Take 1 capsule (50,000 Units total) by mouth once a week. For 12 weeks (Patient not taking: Reported on 08/12/2017), Disp: 12 capsule, Rfl: 0  Allergies  Allergen Reactions  . Sulfa Antibiotics Diarrhea, Hives, Nausea Only and Nausea And Vomiting    Skin peeling, constipation,  Other reaction(s): OTHER  . Other Hives    ONLY lobster      Review of Systems  Constitutional: Negative for fatigue.  Gastrointestinal: Negative for bowel incontinence.  Genitourinary: Negative for bladder incontinence.  Musculoskeletal: Positive for back pain.  Psychiatric/Behavioral: Positive for depression. Negative for suicidal ideas.     Objective  Vitals:   08/12/17 1006  BP: 132/78  Pulse: 89  Resp: 16  Temp: 97.9 F (36.6 C)  TempSrc: Oral  SpO2: 97%  Weight: (!) 404 lb 14.4 oz (183.7 kg)  Height: 5\' 7"  (1.702 m)    Physical Exam  Constitutional: She is oriented to person, place, and time and well-developed, well-nourished, and in no distress.  HENT:  Head: Normocephalic and atraumatic.  Cardiovascular: Normal rate, regular rhythm and normal heart sounds.  No murmur heard. Pulmonary/Chest: Effort normal and breath sounds normal. She has no wheezes.  Musculoskeletal:       Lumbar back: She exhibits tenderness and pain. She exhibits no spasm.        Back:  Neurological: She is alert and oriented to person, place, and time.  Psychiatric: Mood, memory, affect and judgment normal.  Nursing note and vitals reviewed.      Recent Results (from the past 2160 hour(s))  Protein, total     Status: None   Collection Time: 05/27/17 11:01 AM  Result Value Ref Range   Total Protein 6.5 6.1 - 8.1 g/dL     Assessment & Plan  1. Need for immunization against influenza  - Flu Vaccine QUAD 6+ mos PF IM (Fluarix Quad PF)  2. Major depression in complete remission (HCC) Has  now stopped taking Wellbutrin, improved and depression is in remission, she uses meditation for help in difficult situations  3. Vitamin D insufficiency Repeat levels today and consider restarting if still below normal - VITAMIN D 25 Hydroxy (Vit-D Deficiency, Fractures)  4. Chronic right-sided low back pain without sciatica Suspect muscular strain, start on tizanidine, obtain x-rays - DG Lumbar Spine Complete; Future - tiZANidine (ZANAFLEX) 4 MG capsule; Take 1 capsule (4 mg total) by mouth 3 (three) times daily as needed for up to 10 days for muscle spasms.  Dispense: 30 capsule; Refill: 0    Gia Lusher Asad A. Faylene KurtzShah [facility] 08/12/2017 10:18 AM

## 2017-08-13 LAB — VITAMIN D 25 HYDROXY (VIT D DEFICIENCY, FRACTURES): VIT D 25 HYDROXY: 21 ng/mL — AB (ref 30–100)

## 2017-08-14 ENCOUNTER — Telehealth: Payer: Self-pay

## 2017-08-14 DIAGNOSIS — M545 Low back pain, unspecified: Secondary | ICD-10-CM | POA: Insufficient documentation

## 2017-08-14 DIAGNOSIS — G8929 Other chronic pain: Secondary | ICD-10-CM

## 2017-08-14 MED ORDER — TIZANIDINE HCL 4 MG PO TABS: 4.0000 mg | ORAL_TABLET | Freq: Three times a day (TID) | ORAL | 0 refills | Status: AC | PRN

## 2017-08-14 NOTE — Telephone Encounter (Signed)
Tizanidine 4mg  capsule rx should be changed from capsule to tablet and it will be covered by Medicaid.  Copied from CRM [phone]. Topic: Inquiry >> Aug 12, 2017  3:17 PM Crist InfanteHarrald, Kathy J wrote: Reason for CRM: pharmacy states pt's Rx needs a prior auth.  They sent back to the office. Pt called and hopes to get today. Please advise

## 2017-08-14 NOTE — Telephone Encounter (Signed)
Changed Tizanidine from capsule to tablet form and sent to patient's pharmacy

## 2017-08-15 ENCOUNTER — Other Ambulatory Visit: Payer: Self-pay

## 2017-08-15 DIAGNOSIS — E559 Vitamin D deficiency, unspecified: Secondary | ICD-10-CM

## 2017-08-15 MED ORDER — VITAMIN D (ERGOCALCIFEROL) 1.25 MG (50000 UNIT) PO CAPS: 50000.0000 [IU] | ORAL_CAPSULE | ORAL | 0 refills | Status: AC

## 2017-11-11 ENCOUNTER — Ambulatory Visit: Payer: Medicaid Other | Admitting: Family Medicine

## 2019-02-12 ENCOUNTER — Other Ambulatory Visit: Payer: Self-pay | Admitting: Sports Medicine

## 2019-02-12 ENCOUNTER — Other Ambulatory Visit (HOSPITAL_COMMUNITY): Payer: Self-pay | Admitting: Sports Medicine

## 2019-02-12 DIAGNOSIS — G8929 Other chronic pain: Secondary | ICD-10-CM

## 2019-02-12 DIAGNOSIS — W19XXXA Unspecified fall, initial encounter: Secondary | ICD-10-CM

## 2019-02-12 DIAGNOSIS — M25561 Pain in right knee: Secondary | ICD-10-CM

## 2019-02-27 ENCOUNTER — Other Ambulatory Visit: Payer: Self-pay

## 2019-02-27 ENCOUNTER — Ambulatory Visit
Admission: RE | Admit: 2019-02-27 | Discharge: 2019-02-27 | Disposition: A | Discharge status: Home / Self Care | Payer: Medicaid Other | Source: Ambulatory Visit | Attending: Sports Medicine | Admitting: Sports Medicine

## 2019-02-27 DIAGNOSIS — W19XXXA Unspecified fall, initial encounter: Secondary | ICD-10-CM | POA: Diagnosis not present

## 2019-02-27 DIAGNOSIS — M25561 Pain in right knee: Secondary | ICD-10-CM | POA: Diagnosis present

## 2019-02-27 DIAGNOSIS — G8929 Other chronic pain: Secondary | ICD-10-CM | POA: Diagnosis not present

## 2019-03-25 ENCOUNTER — Ambulatory Visit: Payer: Medicaid Other | Admitting: Licensed Clinical Social Worker

## 2019-03-25 ENCOUNTER — Other Ambulatory Visit: Payer: Self-pay

## 2019-04-17 ENCOUNTER — Ambulatory Visit: Payer: Medicaid Other | Admitting: Licensed Clinical Social Worker

## 2020-01-30 IMAGING — MR MRI OF THE RIGHT KNEE WITHOUT CONTRAST
6 series · 40 of 40 positions shown · non-contrast
Comparison: None.

CLINICAL DATA: Carrying case of water up stairs and fell and hit
knee on step 4 months ago, medial knee pain that increases with
weight bearing, Limited ROM

EXAM:
MRI OF THE RIGHT KNEE WITHOUT CONTRAST
TECHNIQUE: Multiplanar, multisequence MR imaging of the knee was performed. No
intravenous contrast was administered.

[Series 9: T2 fat-sat · axial · right · 4.0mm · 0.50mm/px · z∈[-58,+67]mm · 7 of 26 slices shown (1 of 3)]
[im 1/26]
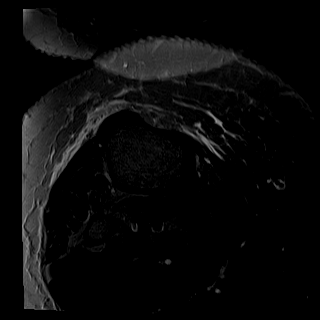
[im 5/26]
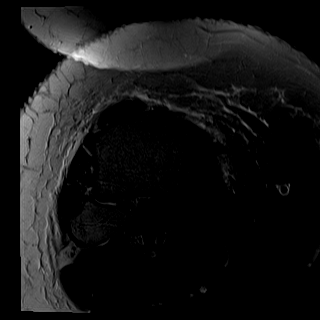
[im 9/26]
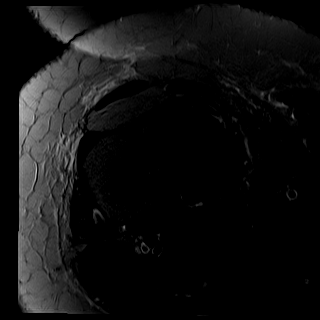
[im 13/26]
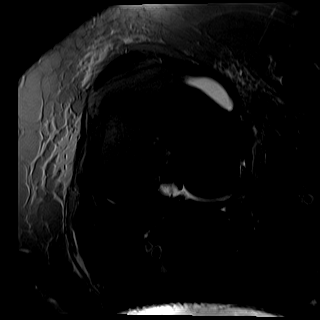
[im 17/26]
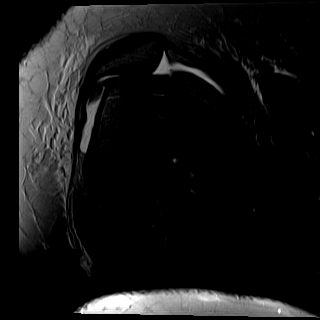
[im 21/26]
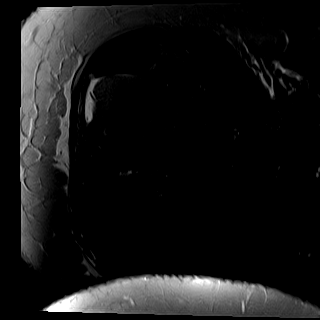
[im 26/26]
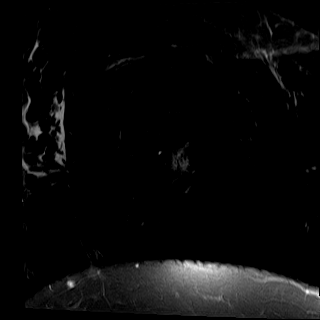

[Series 10: T1 · coronal · right · 4.0mm · 0.59mm/px · 6 of 30 slices shown]
[im 1/30]
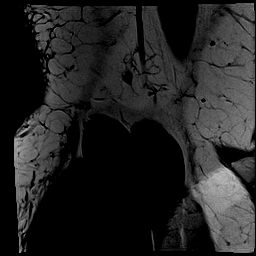
[im 6/30]
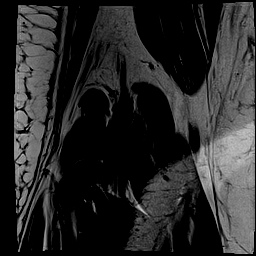
[im 12/30]
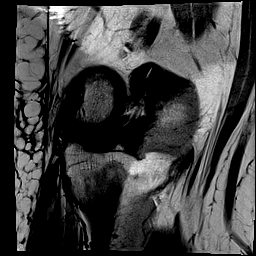
[im 18/30]
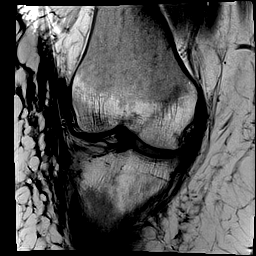
[im 24/30]
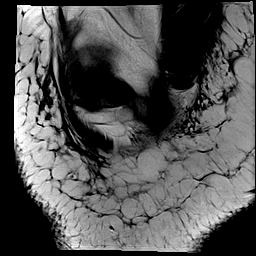
[im 30/30]
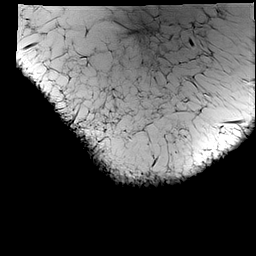

[Series 11: T2 fat-sat · coronal · right · 4.0mm · 0.59mm/px · 6 of 30 slices shown (2 of 3)]
[im 1/30]
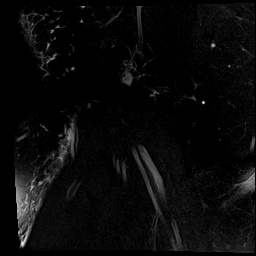
[im 6/30]
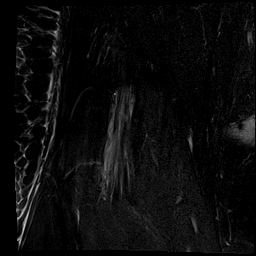
[im 12/30]
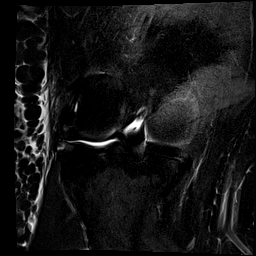
[im 18/30]
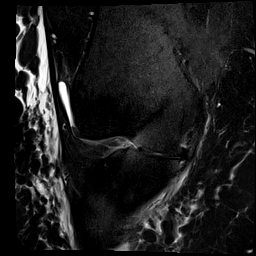
[im 24/30]
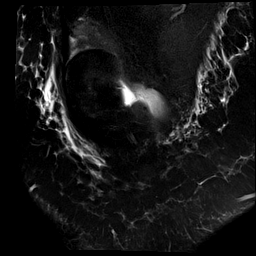
[im 30/30]
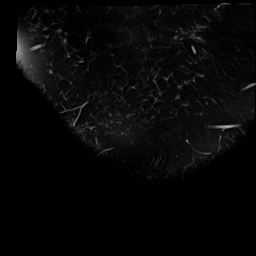

[Series 12: PD fat-sat · coronal · right · 4.0mm · 0.59mm/px · 6 of 30 slices shown (1 of 2)]
[im 1/30]
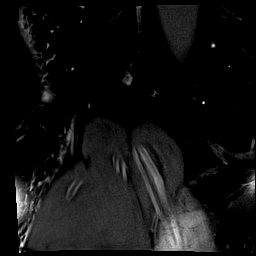
[im 6/30]
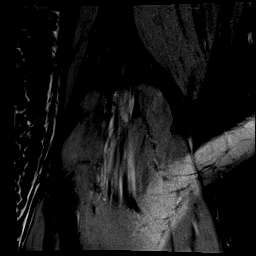
[im 12/30]
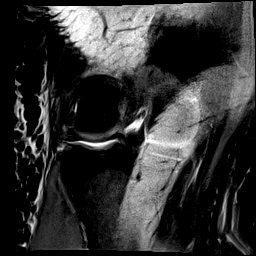
[im 18/30]
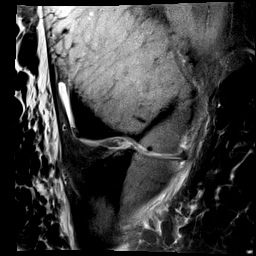
[im 24/30]
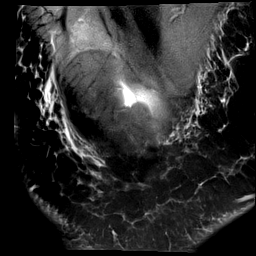
[im 30/30]
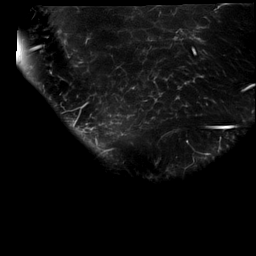

[Series 13: PD fat-sat · sagittal · right · 3.0mm · 0.59mm/px · 7 of 35 slices shown (2 of 2)]
[im 1/35]
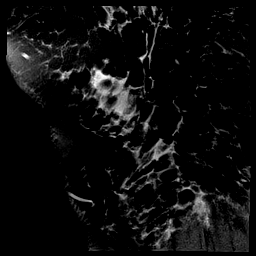
[im 6/35]
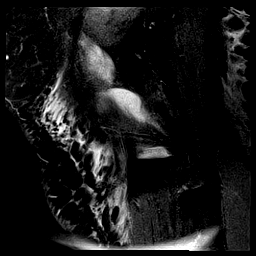
[im 12/35]
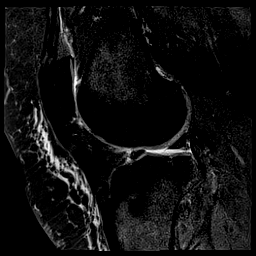
[im 18/35]
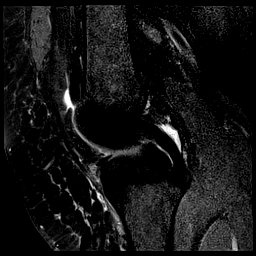
[im 23/35]
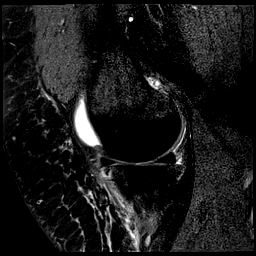
[im 29/35]
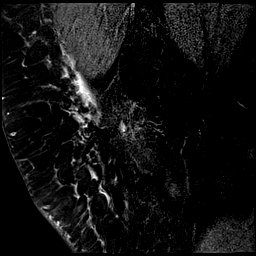
[im 35/35]
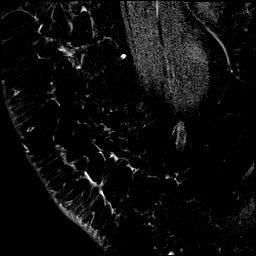

[Series 14: T2 fat-sat · sagittal · right · 3.0mm · 0.59mm/px · 8 of 36 slices shown (3 of 3)]
[im 1/36]
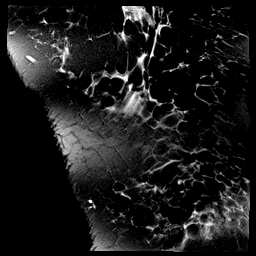
[im 6/36]
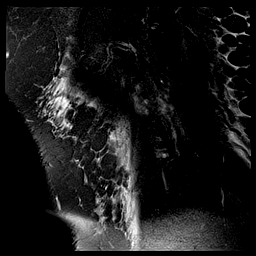
[im 11/36]
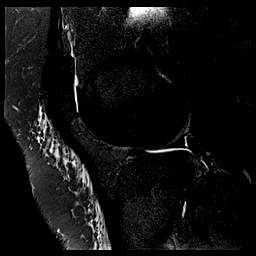
[im 16/36]
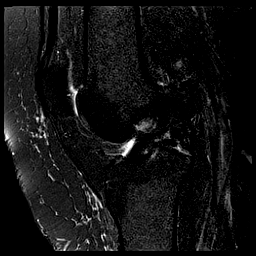
[im 21/36]
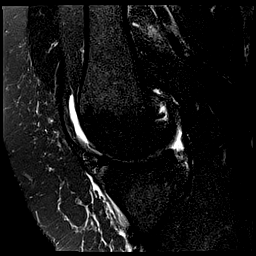
[im 26/36]
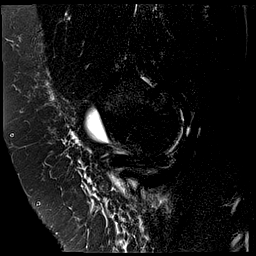
[im 31/36]
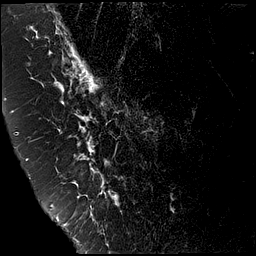
[im 36/36]
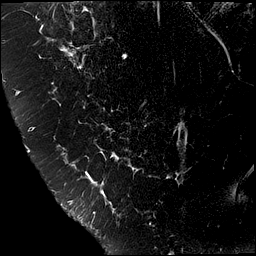

[40 of 40 positions shown; findings below may reference images not displayed]

FINDINGS: MENISCI

Medial meniscus: Small oblique tear of the posterior horn-body
junction extending to the inferior articular surface.

Lateral meniscus:  Intact

LIGAMENTS

Cruciates:  Intact ACL and PCL.

Collaterals: Medial collateral ligament is intact. Lateral
collateral ligament complex is intact.

CARTILAGE

Patellofemoral: Partial thickness cartilage loss of the lateral
patellofemoral compartment

Medial: High-grade partial-thickness cartilage loss of medial
femorotibial compartment with areas of full-thickness cartilage loss
of the medial femoral condyle.

Lateral:  Mild chondral thinning with small marginal osteophytes.

Joint: No joint effusion. Normal Hoffa's fat. No plical thickening.

Popliteal Fossa:  No Baker cyst. Intact popliteus tendon.

Extensor Mechanism: One Intact quadriceps tendon. Intact patellar
tendon. Intact medial patellar retinaculum. Intact lateral patellar
retinaculum. Intact MPFL.

Bones:  No acute osseous abnormality.  No aggressive osseous lesion.

Other: No fluid collection or hematoma.
IMPRESSION: 1. Small oblique tear of the posterior horn-body junction extending
to the inferior articular surface.
2. Tricompartmental cartilage abnormalities as described above most
severe in the medial femorotibial compartment.
